# Patient Record
Sex: Male | Born: 1995 | Race: Black or African American | Hispanic: No | Marital: Single | Smoking: Current every day smoker
Health system: Southern US, Community
[De-identification: ages and names within clinical notes are randomized; demographics above are authoritative.]

## PROBLEM LIST (undated history)

## (undated) ENCOUNTER — Ambulatory Visit (HOSPITAL_COMMUNITY): Payer: No Typology Code available for payment source

## (undated) DIAGNOSIS — W3400XA Accidental discharge from unspecified firearms or gun, initial encounter: Secondary | ICD-10-CM

## (undated) HISTORY — PX: OTHER SURGICAL HISTORY: SHX169

---
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
## 2016-01-25 ENCOUNTER — Emergency Department (HOSPITAL_COMMUNITY): Payer: Medicaid Other

## 2016-01-25 ENCOUNTER — Emergency Department (HOSPITAL_COMMUNITY)
Admission: EM | Admit: 2016-01-25 | Discharge: 2016-01-25 | Disposition: A | Discharge status: Home / Self Care | Payer: Medicaid Other | Attending: Emergency Medicine | Admitting: Emergency Medicine

## 2016-01-25 ENCOUNTER — Encounter (HOSPITAL_COMMUNITY): Payer: Self-pay | Admitting: Emergency Medicine

## 2016-01-25 DIAGNOSIS — Z87828 Personal history of other (healed) physical injury and trauma: Secondary | ICD-10-CM | POA: Insufficient documentation

## 2016-01-25 DIAGNOSIS — Z791 Long term (current) use of non-steroidal anti-inflammatories (NSAID): Secondary | ICD-10-CM | POA: Insufficient documentation

## 2016-01-25 DIAGNOSIS — M79604 Pain in right leg: Secondary | ICD-10-CM | POA: Insufficient documentation

## 2016-01-25 DIAGNOSIS — H9190 Unspecified hearing loss, unspecified ear: Secondary | ICD-10-CM | POA: Insufficient documentation

## 2016-01-25 DIAGNOSIS — F17200 Nicotine dependence, unspecified, uncomplicated: Secondary | ICD-10-CM | POA: Insufficient documentation

## 2016-01-25 DIAGNOSIS — H66001 Acute suppurative otitis media without spontaneous rupture of ear drum, right ear: Secondary | ICD-10-CM | POA: Diagnosis not present

## 2016-01-25 DIAGNOSIS — H9201 Otalgia, right ear: Secondary | ICD-10-CM | POA: Diagnosis present

## 2016-01-25 HISTORY — DX: Accidental discharge from unspecified firearms or gun, initial encounter: W34.00XA

## 2016-01-25 MED ORDER — MELOXICAM 7.5 MG PO TABS: 7.5000 mg | ORAL_TABLET | Freq: Every day | ORAL | Status: DC

## 2016-01-25 MED ORDER — AMOXICILLIN 500 MG PO CAPS: 500.0000 mg | ORAL_CAPSULE | Freq: Three times a day (TID) | ORAL | Status: DC
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
## 2016-01-25 NOTE — Discharge Instructions (Signed)

## 2016-06-30 ENCOUNTER — Emergency Department (HOSPITAL_COMMUNITY)
Admission: EM | Admit: 2016-06-30 | Discharge: 2016-06-30 | Disposition: A | Discharge status: Home / Self Care | Payer: Medicaid Other | Attending: Emergency Medicine | Admitting: Emergency Medicine

## 2016-06-30 ENCOUNTER — Encounter (HOSPITAL_COMMUNITY): Payer: Self-pay

## 2016-06-30 ENCOUNTER — Emergency Department (HOSPITAL_COMMUNITY): Payer: Medicaid Other

## 2016-06-30 DIAGNOSIS — R05 Cough: Secondary | ICD-10-CM | POA: Insufficient documentation

## 2016-06-30 DIAGNOSIS — R519 Headache, unspecified: Secondary | ICD-10-CM

## 2016-06-30 DIAGNOSIS — R51 Headache: Secondary | ICD-10-CM | POA: Diagnosis present

## 2016-06-30 DIAGNOSIS — F17200 Nicotine dependence, unspecified, uncomplicated: Secondary | ICD-10-CM | POA: Insufficient documentation

## 2016-06-30 DIAGNOSIS — R059 Cough, unspecified: Secondary | ICD-10-CM

## 2016-06-30 MED ORDER — PREDNISONE 20 MG PO TABS: 40.0000 mg | ORAL_TABLET | Freq: Every day | ORAL | 0 refills | Status: DC

## 2016-06-30 MED ORDER — BENZONATATE 100 MG PO CAPS: 100.0000 mg | ORAL_CAPSULE | Freq: Three times a day (TID) | ORAL | 0 refills | Status: DC

## 2016-06-30 MED ORDER — IBUPROFEN 200 MG PO TABS
600.0000 mg | ORAL_TABLET | Freq: Once | ORAL | Status: AC
  Administered 2016-06-30: 600 mg via ORAL
  Filled 2016-06-30: qty 3

## 2016-06-30 MED ORDER — IPRATROPIUM-ALBUTEROL 0.5-2.5 (3) MG/3ML IN SOLN
3.0000 mL | Freq: Once | RESPIRATORY_TRACT | Status: AC
  Administered 2016-06-30: 3 mL via RESPIRATORY_TRACT
  Filled 2016-06-30: qty 3

## 2016-06-30 NOTE — ED Triage Notes (Signed)
Patient complains of cough for several months, denies any associated symptoms with same. Patient daily smoker. States this am awoke with generalized headache. No nausea, no blurred vision, alert and oriented. NAD
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
## 2016-06-30 NOTE — ED Provider Notes (Signed)
Medical screening examination/treatment/procedure(s) were conducted as a shared visit with non-physician practitioner(s) and myself.  I personally evaluated the patient during the encounter.   EKG Interpretation None        HPI Comments: Christopher Griffith is a 20 y.o. male who presents to the Emergency Department complaining of a persistent cough for the past couple of months. He reports occasional sputum production but states sometimes it is dry. He reports associated mild CP and SOB when he is having cough episodes. He is a former smoker. He denies history of asthma or COPD. He denies fever, hemoptysis.   He also complains of a daily gradual onset posterior HA that has been ongoing for the past few weeks.  Physical Examination:  Heart: Regular rate and rhythm  Neck: no meningismus  Pulmonary/Chest: CTAB  Neuro: 5/5 strength throughout  Recurrent dry cough. Some chest pain with coughing. No fever.  Lungs clear without wheezing.   By signing my name below, I, Sonum Patel, attest that this documentation has been prepared under the direction and in the presence of Glynn OctaveStephen Livvy Spilman, MD. Electronically Signed: Sonum Patel, Neurosurgeoncribe. 06/30/16. 4:25 PM.   I personally performed the services described in this documentation, which was scribed in my presence. The recorded information has been reviewed and is accurate.    Glynn OctaveStephen Dacota Ruben, MD 06/30/16 [phone]

## 2016-09-24 ENCOUNTER — Emergency Department (HOSPITAL_COMMUNITY): Payer: Medicaid Other

## 2016-09-24 ENCOUNTER — Encounter (HOSPITAL_COMMUNITY): Payer: Self-pay

## 2016-09-24 ENCOUNTER — Emergency Department (HOSPITAL_COMMUNITY)
Admission: EM | Admit: 2016-09-24 | Discharge: 2016-09-24 | Disposition: A | Discharge status: Home / Self Care | Payer: Medicaid Other | Attending: Emergency Medicine | Admitting: Emergency Medicine

## 2016-09-24 DIAGNOSIS — M25562 Pain in left knee: Secondary | ICD-10-CM | POA: Diagnosis not present

## 2016-09-24 MED ORDER — NAPROXEN 250 MG PO TABS
500.0000 mg | ORAL_TABLET | Freq: Once | ORAL | Status: AC
  Administered 2016-09-24: 500 mg via ORAL
  Filled 2016-09-24: qty 2

## 2016-09-24 MED ORDER — NAPROXEN 500 MG PO TABS: 500.0000 mg | ORAL_TABLET | Freq: Two times a day (BID) | ORAL | 0 refills | Status: DC

## 2016-09-24 NOTE — ED Notes (Signed)
Declined W/C at D/C and was escorted to lobby by RN. 

## 2016-09-24 NOTE — ED Provider Notes (Signed)
[facility]EM[facility]Y DEPT Provider Note   CSN: [id number] Arrival date & time: 09/24/16  1719  By signing my name below, I, Rosario AdieWilliam Andrew Hiatt, attest that this documentation has been prepared under the direction and in the presence of Arvilla MeresAshley Nelson Julson, PA-C.  Electronically Signed: Rosario AdieWilliam Andrew Hiatt, ED Scribe. 09/24/16. 6:22 PM.  History   Chief Complaint Chief Complaint  Patient presents with  . Knee Pain   The history is provided by the patient. No language interpreter was used.    HPI Comments: Christopher Griffith is a 20 y.o. male who presents to the Emergency Department complaining of gradually worsening, inferior left knee pain onset this morning. He describes his pain as throbbing. No recent twisting, falls, or trauma to the knee to precipitate his pain. No recent changes in his daily routine. 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 No warmth, redness, or swelling to the area. Pt reports that cryotherapy will mildly alleviate his pain, however, no other treatments for his pain were tried prior to coming into the ED. His pain is exacerbated with movement, ambulation, and weight bearing. No Hx of PE/DVT, recent long travel, surgery, fracture, or prolonged immobilization of the left leg. Pt is not currently followed by a PCP. He denies fever, numbness, weakness, or any other associated symptoms.   History reviewed. No pertinent past medical history.  There are no active problems to display for this patient.  History reviewed. No pertinent surgical history.  Home Medications    Prior to Admission medications   Medication Sig Start Date End Date Taking? Authorizing Provider  naproxen (NAPROSYN) 500 MG tablet Take 1 tablet (500 mg total) by mouth 2 (two) times daily. 09/24/16   Lona KettleAshley Laurel Peter Daquila, PA-C   Family History History reviewed. No pertinent family history.  Social History Social History  Substance Use Topics  . Smoking status: Never Smoker  .  Smokeless tobacco: Never Used  . Alcohol use No   Allergies   Patient has no known allergies.  Review of Systems Review of Systems  Constitutional: Negative for fever.  Musculoskeletal: Positive for arthralgias (left knee) and myalgias. Negative for joint swelling.  Skin: Negative for color change.  Neurological: Negative for weakness and numbness.   Physical Exam Updated Vital Signs BP 114/82 (BP Location: Left Arm)   Pulse 87   Temp 98.2 F (36.8 C) (Oral)   Resp 16   SpO2 100%   Physical Exam  Constitutional: He appears well-developed and well-nourished. No distress.  HENT:  Head: Normocephalic and atraumatic.  Eyes: Conjunctivae are normal. No scleral icterus.  Neck: Normal range of motion.  Pulmonary/Chest: Effort normal. No respiratory distress.  Abdominal: He exhibits no distension.  Musculoskeletal: He exhibits tenderness. He exhibits no edema or deformity.       Left knee: Tenderness found.  Left knee: No warmth, redness, or swelling to the area. Tender to the inferior aspect of his knee. Patella is stable. ROM and strength testing is limited secondary to pain. Sensation and distal pulses are intact distally.   Neurological: He is alert.  Skin: Skin is warm and dry. He is not diaphoretic.  Psychiatric: He has a normal mood and affect. His behavior is normal.   ED Treatments / Results  DIAGNOSTIC STUDIES: Oxygen Saturation is 100% on RA, normal by my interpretation.   COORDINATION OF CARE: 6:22 PM-Discussed next steps with pt. Pt verbalized understanding and is agreeable with the plan.   Radiology Dg Knee  Complete 4 Views Left  Result Date: 09/24/2016 CLINICAL DATA:  Initial evaluation for acute left knee pain. EXAM: LEFT KNEE - COMPLETE 4+ VIEW COMPARISON:  None. FINDINGS: No evidence of fracture, dislocation, or joint effusion. No evidence of arthropathy or other focal bone abnormality. Soft tissues are unremarkable. IMPRESSION: No acute osseous abnormality  about the left knee. Electronically Signed   By: Rise MuBenjamin  McClintock M.D.   On: 09/24/2016 18:11   Procedures Procedures   Medications Ordered in ED Medications  naproxen (NAPROSYN) tablet 500 mg (500 mg Oral Given 09/24/16 1841)    Initial Impression / Assessment and Plan / ED Course  I have reviewed the triage vital signs and the nursing notes.  Pertinent labs & imaging results that were available during my care of the patient were reviewed by me and considered in my medical decision making (see chart for details).  Clinical [facility] of Sep 24 1905  [facility] No obvious fracture or dislocation.  DG Knee Complete 4 Views Left [AM]    Clinical Course User Index [AM] Lona Kettleshley Laurel Samaiyah Howes, PA-C   This is a [age]yo male with no other PMHx, who presents into the ED with left knee pain which began this morning. No acute injury to precipitate his pain. Exam with tenderness over the inferior aspect of the left knee. No warmth, erythema, swelling, or deformity; afebrile - low suspicion for septic joint or gout at this time. Will obtain DG left knee.    6:24 PM Patient XR negative for obvious fracture, dislocation, or other bony abnormalities. Pain managed in ED. Pt advised to follow up with orthopedics if symptoms persist for possibility of missed fracture diagnosis. Patient given brace while in ED, conservative therapy recommended and discussed. Rx naprosyn. Patient will be d/c home. Pt is comfortable with above plan and is stable for discharge at this time. All questions were answered prior to disposition. Strict return precautions for f/u into the ED were discussed.   Final Clinical Impressions(s) / ED Diagnoses   Final diagnoses:  Acute pain of left knee   New Prescriptions Discharge Medication List as of 09/24/2016  6:30 PM    START taking these medications   Details  naproxen (NAPROSYN) 500 MG tablet Take 1 tablet (500 mg total) by mouth 2 (two) times daily., Starting  Mon 09/24/2016, Print       I personally performed the services described in this documentation, which was scribed in my presence. The recorded information has been reviewed and is accurate.    Lona KettleAshley Laurel Marinell Igarashi, PA-C 09/24/16 1906    Raeford RazorStephen Kohut, MD 10/02/16 [phone]

## 2016-09-24 NOTE — ED Triage Notes (Signed)
Pt reports left knee pain. Pt denies injury. No swelling noted. Pt limping to triage.

## 2016-09-24 NOTE — Discharge Instructions (Signed)
Read the information below.  Your x-ray did not show any obvious fracture or dislocation. You are being provided a knee sleeve and crutches. Please use for the next 2-3 days. Please ice and elevate for 20 minute increments for the next 2-3 days. I have prescribed naprosyn for pain relief. While taking naprosyn do not take other NSAIDs (ibuprofen, motrin, aleve).  Use the prescribed medication as directed.  Please discuss all new medications with your pharmacist.   If symptoms persist, please call orthopedics - I have provided the contact information above.  You may return to the Emergency Department at any time for worsening condition or any new symptoms that concern you.

## 2017-01-01 ENCOUNTER — Encounter (HOSPITAL_COMMUNITY): Payer: Self-pay

## 2018-07-14 IMAGING — CR DG KNEE COMPLETE 4+V*L*
4 series · 4 of 4 positions shown · non-contrast
Comparison: None.

CLINICAL DATA: Initial evaluation for acute left knee pain.

EXAM:
LEFT KNEE - COMPLETE 4+ VIEW

[knee ap]
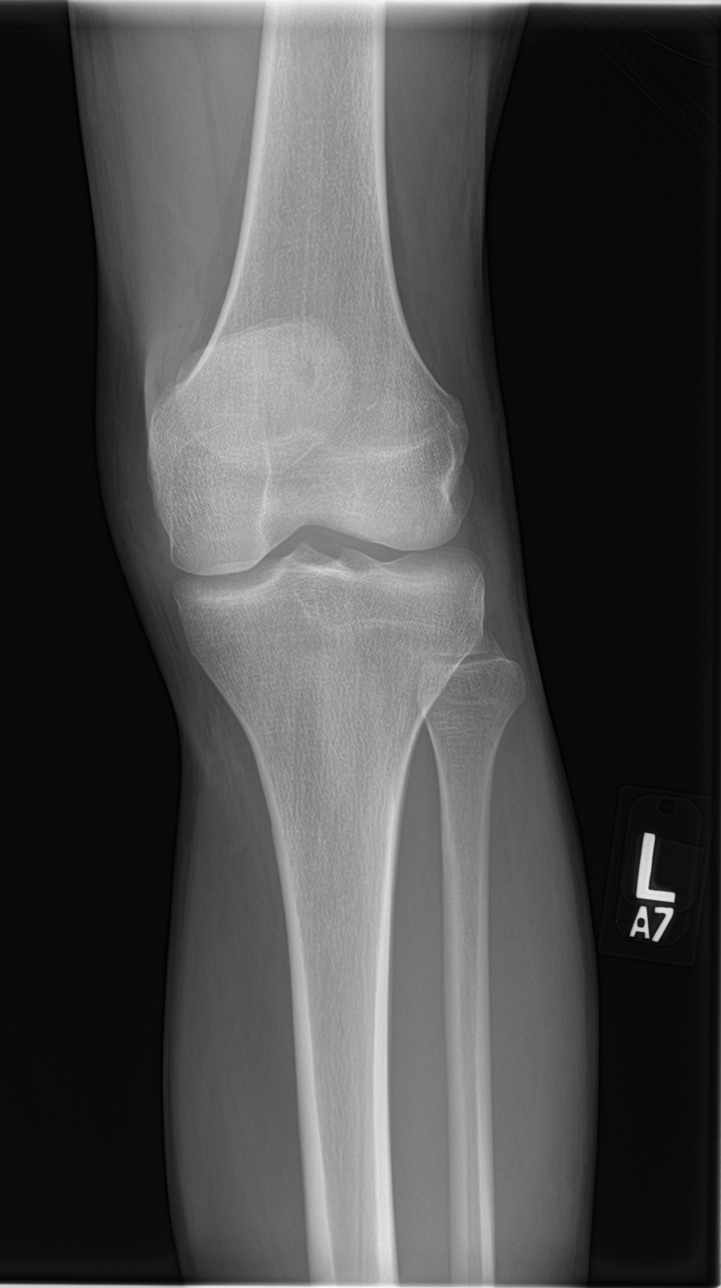

[knee lat]
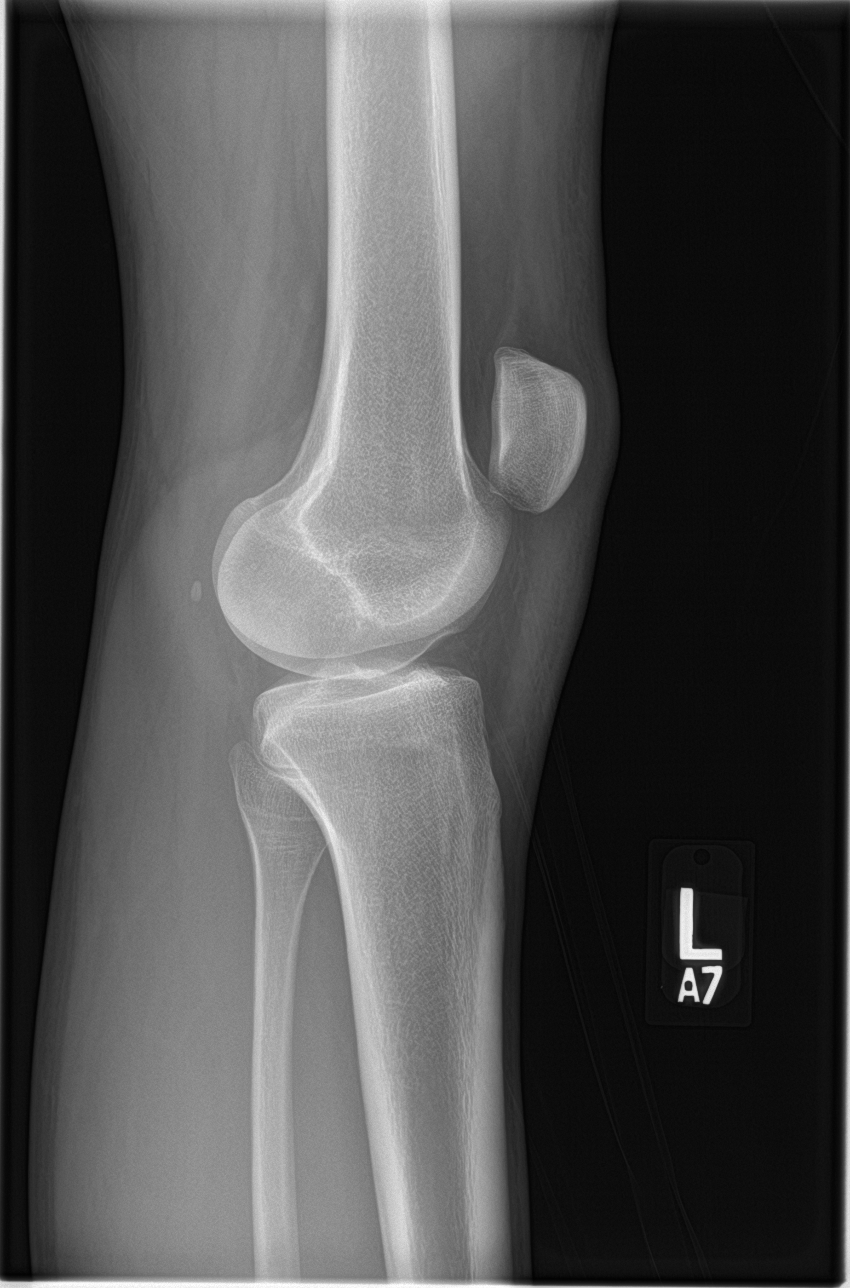

[knee obl (1 of 2)]
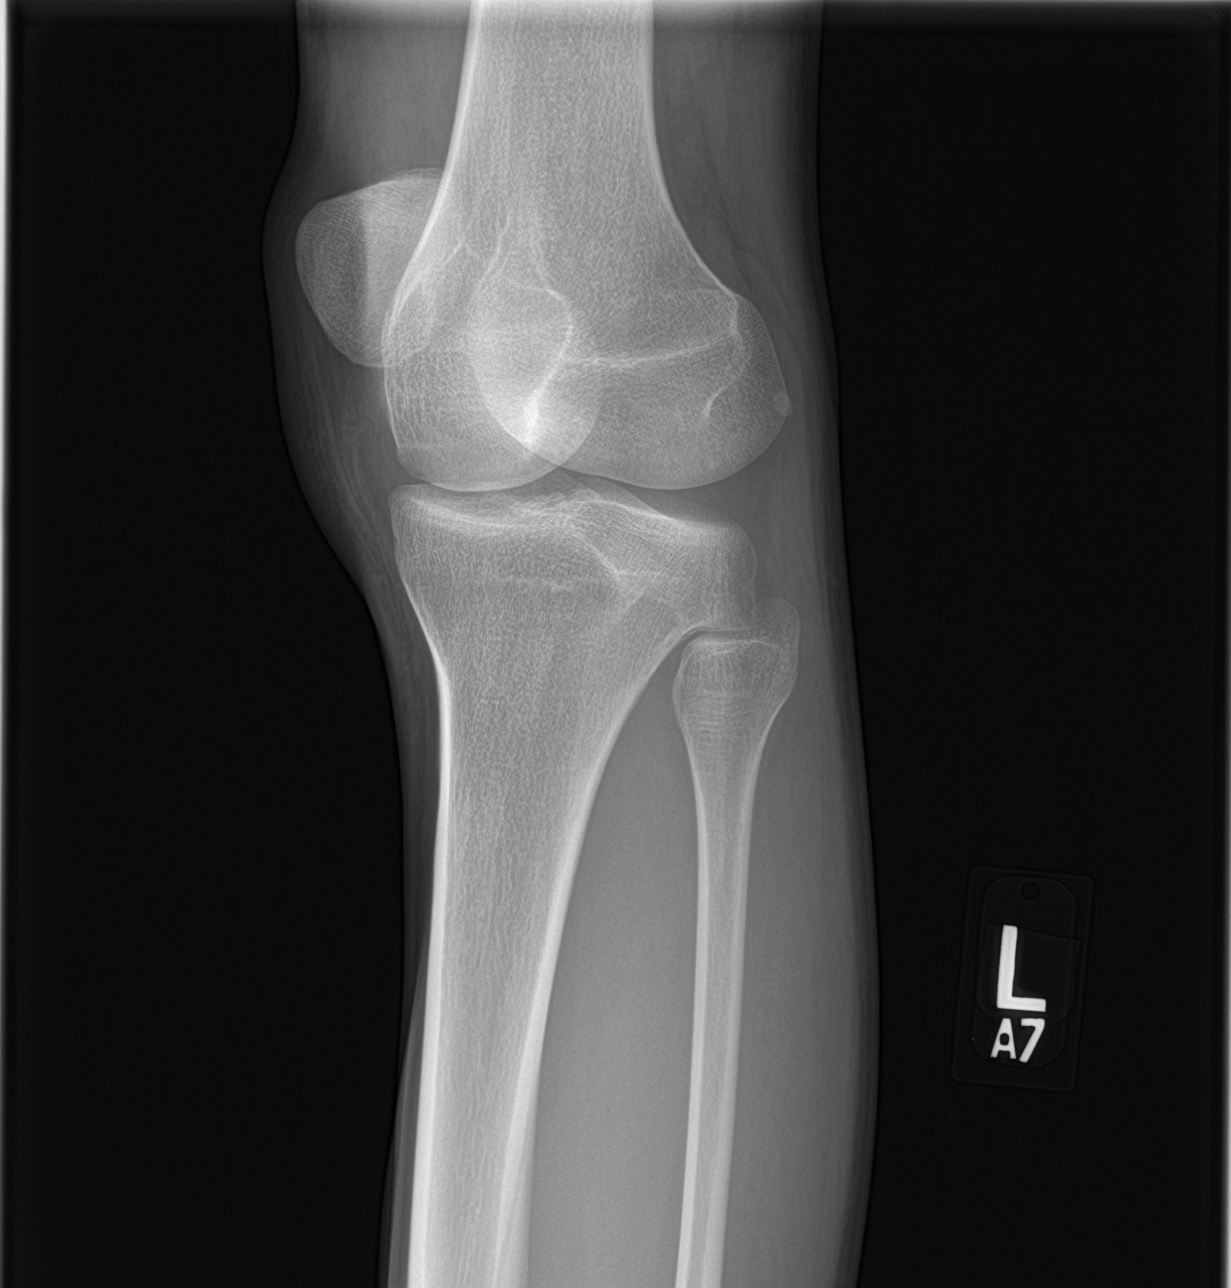

[knee obl (2 of 2)]
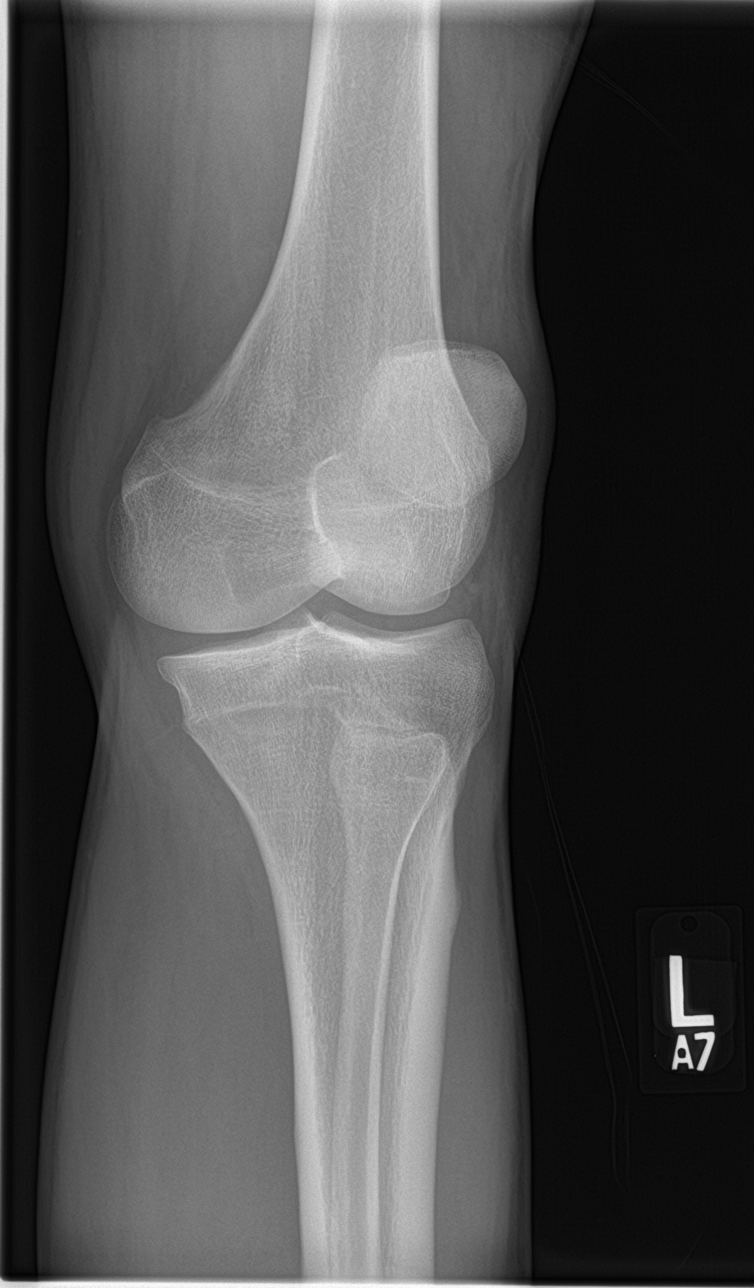

[4 of 4 positions shown; findings below may reference images not displayed]

FINDINGS: No evidence of fracture, dislocation, or joint effusion. No evidence
of arthropathy or other focal bone abnormality. Soft tissues are
unremarkable.
IMPRESSION: No acute osseous abnormality about the left knee.

## 2020-11-16 ENCOUNTER — Other Ambulatory Visit: Payer: Self-pay

## 2022-07-18 ENCOUNTER — Encounter (HOSPITAL_BASED_OUTPATIENT_CLINIC_OR_DEPARTMENT_OTHER): Payer: Self-pay | Admitting: Emergency Medicine

## 2022-07-18 ENCOUNTER — Emergency Department (HOSPITAL_BASED_OUTPATIENT_CLINIC_OR_DEPARTMENT_OTHER)
Admission: EM | Admit: 2022-07-18 | Discharge: 2022-07-18 | Disposition: A | Discharge status: Home / Self Care | Payer: No Typology Code available for payment source | Attending: Emergency Medicine | Admitting: Emergency Medicine

## 2022-07-18 ENCOUNTER — Emergency Department (HOSPITAL_BASED_OUTPATIENT_CLINIC_OR_DEPARTMENT_OTHER): Payer: No Typology Code available for payment source

## 2022-07-18 DIAGNOSIS — Y92410 Unspecified street and highway as the place of occurrence of the external cause: Secondary | ICD-10-CM | POA: Insufficient documentation

## 2022-07-18 DIAGNOSIS — F17290 Nicotine dependence, other tobacco product, uncomplicated: Secondary | ICD-10-CM | POA: Diagnosis not present

## 2022-07-18 DIAGNOSIS — F17210 Nicotine dependence, cigarettes, uncomplicated: Secondary | ICD-10-CM | POA: Insufficient documentation

## 2022-07-18 DIAGNOSIS — R0781 Pleurodynia: Secondary | ICD-10-CM | POA: Diagnosis not present

## 2022-07-18 DIAGNOSIS — S01511A Laceration without foreign body of lip, initial encounter: Secondary | ICD-10-CM | POA: Diagnosis not present

## 2022-07-18 DIAGNOSIS — M542 Cervicalgia: Secondary | ICD-10-CM | POA: Diagnosis not present

## 2022-07-18 DIAGNOSIS — S161XXA Strain of muscle, fascia and tendon at neck level, initial encounter: Secondary | ICD-10-CM

## 2022-07-18 DIAGNOSIS — R42 Dizziness and giddiness: Secondary | ICD-10-CM | POA: Diagnosis not present

## 2022-07-18 DIAGNOSIS — S298XXA Other specified injuries of thorax, initial encounter: Secondary | ICD-10-CM

## 2022-07-18 DIAGNOSIS — S0993XA Unspecified injury of face, initial encounter: Secondary | ICD-10-CM | POA: Diagnosis present

## 2022-07-18 MED ORDER — LIDOCAINE-EPINEPHRINE (PF) 2 %-1:200000 IJ SOLN
20.0000 mL | Freq: Once | INTRAMUSCULAR | Status: AC
  Administered 2022-07-18: 20 mL via INTRADERMAL
  Filled 2022-07-18: qty 20

## 2022-07-18 NOTE — ED Notes (Signed)
Patient transported to CT and Xray at this time via stretcher- awake and alert.  Speech clear and able to follow simple commands- prior to departure pt reports ongoing HA, posterior neck, facial and L side pain.  Also reports mild nausea, blurry vision and mild dizziness.
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
## 2022-07-18 NOTE — ED Notes (Signed)
Pt agreeable with d/c plan as discussed by provider- this nurse has verbally reinforced d/c instructions and provided pt with written copy- pt acknowledges verbal understanding and denies any addl questions concerns needs- pt ambulatory at d/c with steady gait; no acute changes/distress.

## 2023-11-08 ENCOUNTER — Other Ambulatory Visit: Payer: Self-pay

## 2023-11-08 ENCOUNTER — Emergency Department (HOSPITAL_COMMUNITY)
Admission: EM | Admit: 2023-11-08 | Discharge: 2023-11-08 | Disposition: A | Discharge status: Home / Self Care | Payer: BLUE CROSS/BLUE SHIELD | Attending: Emergency Medicine | Admitting: Emergency Medicine

## 2023-11-08 ENCOUNTER — Emergency Department (HOSPITAL_COMMUNITY): Payer: BLUE CROSS/BLUE SHIELD

## 2023-11-08 ENCOUNTER — Encounter (HOSPITAL_COMMUNITY): Payer: Self-pay

## 2023-11-08 DIAGNOSIS — Y92410 Unspecified street and highway as the place of occurrence of the external cause: Secondary | ICD-10-CM | POA: Diagnosis not present

## 2023-11-08 DIAGNOSIS — M25512 Pain in left shoulder: Secondary | ICD-10-CM | POA: Diagnosis not present

## 2023-11-08 DIAGNOSIS — M542 Cervicalgia: Secondary | ICD-10-CM | POA: Insufficient documentation

## 2023-11-08 DIAGNOSIS — R519 Headache, unspecified: Secondary | ICD-10-CM | POA: Insufficient documentation

## 2023-11-08 DIAGNOSIS — S161XXA Strain of muscle, fascia and tendon at neck level, initial encounter: Secondary | ICD-10-CM

## 2023-11-08 MED ORDER — HYDROCODONE-ACETAMINOPHEN 5-325 MG PO TABS
1.0000 | ORAL_TABLET | Freq: Once | ORAL | Status: AC
  Administered 2023-11-08: 1 via ORAL
  Filled 2023-11-08: qty 1

## 2023-11-08 MED ORDER — HYDROCODONE-ACETAMINOPHEN 5-325 MG PO TABS: 1.0000 | ORAL_TABLET | Freq: Four times a day (QID) | ORAL | 0 refills | Status: AC | PRN
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
